# Patient Record
Sex: Male | Born: 2000 | Race: Black or African American | Hispanic: No | Marital: Single
Health system: Southern US, Community
[De-identification: ages and names within clinical notes are randomized; demographics above are authoritative.]

## PROBLEM LIST (undated history)

## (undated) HISTORY — PX: TONSILLECTOMY: SUR1361

---
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
## 2022-07-03 ENCOUNTER — Encounter (HOSPITAL_BASED_OUTPATIENT_CLINIC_OR_DEPARTMENT_OTHER): Payer: Self-pay

## 2022-07-03 ENCOUNTER — Emergency Department (HOSPITAL_BASED_OUTPATIENT_CLINIC_OR_DEPARTMENT_OTHER)
Admission: EM | Admit: 2022-07-03 | Discharge: 2022-07-04 | Disposition: A | Discharge status: Home / Self Care | Payer: Medicaid Other | Attending: Emergency Medicine | Admitting: Emergency Medicine

## 2022-07-03 DIAGNOSIS — Z7722 Contact with and (suspected) exposure to environmental tobacco smoke (acute) (chronic): Secondary | ICD-10-CM | POA: Diagnosis not present

## 2022-07-03 DIAGNOSIS — H9201 Otalgia, right ear: Secondary | ICD-10-CM | POA: Diagnosis present

## 2022-07-03 DIAGNOSIS — H6121 Impacted cerumen, right ear: Secondary | ICD-10-CM

## 2022-07-03 NOTE — ED Triage Notes (Signed)
Rt. Ear pain and difficulty hearing x 2 days

## 2022-07-04 NOTE — Discharge Instructions (Signed)
You were evaluated in the Emergency Department and after careful evaluation, we did not find any emergent condition requiring admission or further testing in the hospital.  Your exam/testing today was overall reassuring.  Symptoms seem to be due to a buildup of wax in the ears.  Recommend using a one-to-one mixture of water and hydrogen peroxide and placing a small amount in the ear laying on your side with the ear up, allowing the liquid to break down the wax for a few minutes, and then rolling over and allowing the ear to drain.  You can repeat this process multiple times a day.  Please return to the Emergency Department if you experience any worsening of your condition.  Thank you for allowing [place] to be a part of your care.

## 2022-07-04 NOTE — ED Provider Notes (Signed)
[facility] Emergency Department Provider Note MRN:  [id number]  Arrival date & time: 07/04/22     Chief Complaint   Otalgia   History of Present Illness   Marcus Jacobson is a 21 y.o. year-old male with no pertinent past medical history presenting to the ED with chief complaint of otalgia.  Feeling of fullness of the right ear for the past 2 days.  Feels like it is clogged.  Not terribly painful.  Feels like he cannot hear out of it very well.  Review of Systems  A thorough review of systems was obtained and all systems are negative except as noted in the HPI and PMH.   Patient's Health History   History reviewed. No pertinent past medical history.  Past Surgical History:  Procedure Laterality Date   TONSILLECTOMY     age 61 years    No family history on file.  Social History   Socioeconomic History   Marital status: Single    Spouse name: Not on file   Number of children: Not on file   Years of education: Not on file   Highest education level: Not on file  Occupational History   Not on file  Tobacco Use   Smoking status: Passive Smoke Exposure - Never Smoker   Smokeless tobacco: Never  Substance and Sexual Activity   Alcohol use: Not on file   Drug use: Not on file   Sexual activity: Not on file  Other Topics Concern   Not on file  Social History Narrative   Not on file   Social Determinants of Health   Financial Resource Strain: Not on file  Food Insecurity: Not on file  Transportation Needs: Not on file  Physical Activity: Not on file  Stress: Not on file  Social Connections: Not on file  Intimate Partner Violence: Not on file     Physical Exam   Vitals:   07/03/22 2110  BP: (!) 143/80  Pulse: 76  Resp: 18  Temp: 99 F (37.2 C)  SpO2: 99%    CONSTITUTIONAL: Well-appearing, NAD NEURO/PSYCH:  Alert and oriented x 3, no focal deficits EYES:  eyes equal and reactive ENT/NECK:  no LAD, no JVD CARDIO: Regular rate,  well-perfused, normal S1 and S2 PULM:  CTAB no wheezing or rhonchi GI/GU:  non-distended, non-tender MSK/SPINE:  No gross deformities, no edema SKIN:  no rash, atraumatic   *Additional and/or pertinent findings included in MDM below  Diagnostic and Interventional Summary    EKG Interpretation  Date/Time:    Ventricular Rate:    PR Interval:    QRS Duration:   QT Interval:    QTC Calculation:   R Axis:     Text Interpretation:         Labs Reviewed - No data to display  No orders to display    Medications - No data to display   Procedures  /  Critical Care Procedures  ED Course and Medical Decision Making  Initial Impression and Ddx Cerumen impaction on exam, fitting with history.  No other complicating features, appropriate for discharge.  Past medical/surgical history that increases complexity of ED encounter: None  Interpretation of Diagnostics Laboratory and/or imaging options to aid in the diagnosis/care of the patient were considered.  After careful history and physical examination, it was determined that there was no indication for diagnostics at this time.  Patient Reassessment and Ultimate Disposition/Management     Discharge  Patient management required discussion with the  following services or consulting groups:  None  Complexity of Problems Addressed Acute complicated illness or Injury  Additional Data Reviewed and Analyzed Further history obtained from: None  Additional Factors Impacting ED Encounter Risk None  Elmer Sow. Pilar Plate, MD [facility] Emergency Medicine [facility] [email or web address]  Final Clinical Impressions(s) / ED Diagnoses     ICD-10-CM   1. Impacted cerumen of right ear  H61.21       ED Discharge Orders     None        Discharge Instructions Discussed with and Provided to Patient:    Discharge Instructions      You were evaluated in the Emergency Department and after careful evaluation, we  did not find any emergent condition requiring admission or further testing in the hospital.  Your exam/testing today was overall reassuring.  Symptoms seem to be due to a buildup of wax in the ears.  Recommend using a one-to-one mixture of water and hydrogen peroxide and placing a small amount in the ear laying on your side with the ear up, allowing the liquid to break down the wax for a few minutes, and then rolling over and allowing the ear to drain.  You can repeat this process multiple times a day.  Please return to the Emergency Department if you experience any worsening of your condition.  Thank you for allowing [place] to be a part of your care.       Sabas Sous, MD 07/04/22 [phone]

## 2023-01-14 ENCOUNTER — Emergency Department (HOSPITAL_BASED_OUTPATIENT_CLINIC_OR_DEPARTMENT_OTHER)
Admission: EM | Admit: 2023-01-14 | Discharge: 2023-01-14 | Disposition: A | Discharge status: Home / Self Care | Payer: Managed Care, Other (non HMO) | Attending: Emergency Medicine | Admitting: Emergency Medicine

## 2023-01-14 ENCOUNTER — Other Ambulatory Visit: Payer: Self-pay

## 2023-01-14 ENCOUNTER — Encounter (HOSPITAL_BASED_OUTPATIENT_CLINIC_OR_DEPARTMENT_OTHER): Payer: Self-pay | Admitting: Emergency Medicine

## 2023-01-14 DIAGNOSIS — R55 Syncope and collapse: Secondary | ICD-10-CM

## 2023-01-14 LAB — COMPREHENSIVE METABOLIC PANEL
ALT: 11 U/L (ref 0–44)
AST: 22 U/L (ref 15–41)
Albumin: 5 g/dL (ref 3.5–5.0)
Alkaline Phosphatase: 42 U/L (ref 38–126)
Anion gap: 7 (ref 5–15)
BUN: 14 mg/dL (ref 6–20)
CO2: 28 mmol/L (ref 22–32)
Calcium: 10 mg/dL (ref 8.9–10.3)
Chloride: 102 mmol/L (ref 98–111)
Creatinine, Ser: 1.06 mg/dL (ref 0.61–1.24)
GFR, Estimated: >60 mL/min (ref >60)
Glucose, Bld: 92 mg/dL (ref 70–99)
Potassium: 4.4 mmol/L (ref 3.5–5.1)
Sodium: 137 mmol/L (ref 135–145)
Total Bilirubin: 1.2 mg/dL (ref 0.3–1.2)
Total Protein: 7.7 g/dL (ref 6.5–8.1)

## 2023-01-14 LAB — LIPASE, BLOOD: Lipase: 17 U/L (ref 11–51)

## 2023-01-14 NOTE — ED Notes (Signed)
Pt aware of the need for a urine... Unable to currently provide.Marland KitchenMarland Kitchen

## 2023-01-14 NOTE — ED Provider Notes (Signed)
[facility] Provider Note   CSN: [id number] Arrival date & time: 01/14/23  1113     History  Chief Complaint  Patient presents with   Abdominal Pain    Marcus Jacobson is a 22 y.o. male.   Abdominal Pain   22 year old male presents emergency department with request of work note.  Patient states that he had an episode this past Thursday where he passed out after having a bowel movement getting up from the toilet.  States that EMS "check me out and cleared me."  Reports some cramping type abdominal pain prompting his visit to the toilet at that time.  States that since then, he has had no abdominal pain.  Denies fever, nausea, vomiting, abdominal pain, chest pain, shortness of breath, urinary symptoms, change in bowel habits.  Patient states that symptoms are isolated to that episode without recurrence.  Denies family history of sudden death at a young age.  Patient currently requesting work note and states "that is all he came here for."  Patient denies visual disturbance, gait abnormalities, weakness/sensory deficits in upper or lower extremities, slurred speech, facial droop, blood thinner use, trauma to head.  No significant pertinent past medical history.  Home Medications Prior to Admission medications   Medication Sig Start Date End Date Taking? Authorizing Provider  ciprofloxacin-dexamethasone (CIPRODEX) otic suspension Place 4 drops into the right ear 2 (two) times daily. For 7 days Patient not taking: Reported on 01/23/2017 08/08/15   Melony Overly, MD      Allergies    Patient has no known allergies.    Review of Systems   Review of Systems  Gastrointestinal:  Positive for abdominal pain.  All other systems reviewed and are negative.   Physical Exam Updated Vital Signs BP 120/83 (BP Location: Right Arm)   Pulse (!) 51   Temp 97.9 F (36.6 C) (Oral)   Resp 18   SpO2 100%  Physical Exam Vitals and nursing note reviewed.   Constitutional:      General: He is not in acute distress.    Appearance: He is well-developed.  HENT:     Head: Normocephalic and atraumatic.  Eyes:     Conjunctiva/sclera: Conjunctivae normal.  Cardiovascular:     Rate and Rhythm: Normal rate and regular rhythm.     Heart sounds: No murmur heard. Pulmonary:     Effort: Pulmonary effort is normal. No respiratory distress.     Breath sounds: Normal breath sounds.  Abdominal:     Palpations: Abdomen is soft.     Tenderness: There is no abdominal tenderness.  Musculoskeletal:        General: No swelling.     Cervical back: Neck supple.  Skin:    General: Skin is warm and dry.     Capillary Refill: Capillary refill takes less than 2 seconds.  Neurological:     Mental Status: He is alert.     Comments: Alert and oriented to self, place, time and event.   Speech is fluent, clear without dysarthria or dysphasia.   Strength 5/5 in upper/lower extremities   Sensation intact in upper/lower extremities   Normal gait.  Negative Romberg. No pronator drift.  Normal finger-to-nose and feet tapping.  CN I not tested  CN II not tested.  CN III, IV, VI PERRLA and EOMs intact bilaterally  CN V Intact sensation to sharp and light touch to the face  CN VII facial movements symmetric  CN VIII  not tested  CN IX, X no uvula deviation, symmetric rise of soft palate  CN XI 5/5 SCM and trapezius strength bilaterally  CN XII Midline tongue protrusion, symmetric L/R movements     Psychiatric:        Mood and Affect: Mood normal.     ED Results / Procedures / Treatments   Labs (all labs ordered are listed, but only abnormal results are displayed) Labs Reviewed  LIPASE, BLOOD  COMPREHENSIVE METABOLIC PANEL  URINALYSIS, ROUTINE W REFLEX MICROSCOPIC  CBC    EKG EKG Interpretation  Date/Time:  Sunday January 14 2023 14:34:13 EST Ventricular Rate:  47 PR Interval:  153 QRS Duration: 69 QT Interval:  435 QTC Calculation: 385 R  Axis:   78 Text Interpretation: Sinus bradycardia early repolarization , present on prior tracing but rate much slower than previous Confirmed by Charlesetta Shanks [id number]) on 01/14/2023 3:39:15 PM  Radiology No results found.  Procedures Procedures    Medications Ordered in ED Medications - No data to display  ED Course/ Medical Decision Making/ A&P                             Medical Decision Making Amount and/or Complexity of Data Reviewed Labs: ordered.   This patient presents to the ED for concern of syncope, this involves an extensive number of treatment options, and is a complaint that carries with it a high risk of complications and morbidity.  The differential diagnosis includes Vasovagal, prolonged QT, Wolff-Parkinson-White, Brugada syndrome, hypertrophic cardiomyopathy, aortic stenosis, pulmonary embolism, thoracic aortic dissection, sepsis, GI bleed, ruptured AAA, subarachnoid hemorrhage, CVA.  Co morbidities that complicate the patient evaluation  See HPI   Additional history obtained:  Additional history obtained from EMR External records from outside source obtained and reviewed including hospital records   Lab Tests:  I Ordered, and personally interpreted labs.  The pertinent results include: No electrolyte abnormalities appreciated.  No transaminitis.  No renal dysfunction.  Base within normal limits.  CBC, UA pending.   Imaging Studies ordered:  N/a   Cardiac Monitoring: / EKG:  The patient was maintained on a cardiac monitor.  I personally viewed and interpreted the cardiac monitored which showed an underlying rhythm of: Sinus rhythm with early repolarization pattern   Consultations Obtained:  N/a   Problem List / ED Course / Critical interventions / Medication management  Syncope  Reevaluation of the patient showed that the patient stayed the same I have reviewed the patients home medicines and have made adjustments as needed   Social  Determinants of Health:  Denies tobacco, illicit drug use   Test / Admission - Considered:  Syncope Vitals signs  within normal range and stable throughout visit. Laboratory studies significant for: See above Patient with evidence of syncopal episode most likely vasovagal in nature given HPI.  Patient asymptomatic now for 3 to 4 days and presenting to the emergency department requesting work note.  Patient overall well-appearing with no abdominal tenderness.  No reported history of sudden cardiac death at young age and family.  I discussed with the patient desire to stay until laboratory studies had been conducted with patient elected leave prior to results.  Patient was deemed capacity.  Patient recommended follow-up with primary care for reassessment of symptoms.  Treatment plan discussed at length with patient and he acknowledged understanding was agreeable to said plan. Worrisome signs and symptoms were discussed with the patient, and the patient  acknowledged understanding to return to the ED if noticed. Patient was stable upon discharge.          Final Clinical Impression(s) / ED Diagnoses Final diagnoses:  Syncope, unspecified syncope type    Rx / DC Orders ED Discharge Orders     None         Wilnette Kales, [place] 01/14/23 Merrily Pew    Charlesetta Shanks, MD 01/18/23 1711

## 2023-01-14 NOTE — Discharge Instructions (Addendum)
Note the visit emergency department was overall reassuring.  Recommend reevaluation by primary care for reassessment of symptoms.  As discussed, make sure to maintain proper oral hydration via electrolyte rich fluids. Make sure to get up slowly from a seated/laying down position.  Please do not hesitate to return to emergency department for worrisome signs and symptoms we discussed become apparent.

## 2023-01-14 NOTE — ED Triage Notes (Signed)
Pt presents to ED POV. Pt c/o generalize abd cramping since thurday. Pt reports pain is intermittent and 3/10. Pt reports that he had a syncopal event after having BM on Thursday. EMS came and cleared him.

## 2023-08-05 ENCOUNTER — Other Ambulatory Visit: Payer: Self-pay

## 2023-08-05 ENCOUNTER — Emergency Department (HOSPITAL_COMMUNITY)
Admission: EM | Admit: 2023-08-05 | Discharge: 2023-08-05 | Disposition: A | Discharge status: Home / Self Care | Payer: 59 | Attending: Emergency Medicine | Admitting: Emergency Medicine

## 2023-08-05 DIAGNOSIS — R42 Dizziness and giddiness: Secondary | ICD-10-CM | POA: Diagnosis not present

## 2023-08-05 DIAGNOSIS — R0981 Nasal congestion: Secondary | ICD-10-CM | POA: Diagnosis not present

## 2023-08-05 DIAGNOSIS — R55 Syncope and collapse: Secondary | ICD-10-CM | POA: Insufficient documentation

## 2023-08-05 LAB — URINALYSIS, ROUTINE W REFLEX MICROSCOPIC
Bilirubin Urine: NEGATIVE
Glucose, UA: NEGATIVE mg/dL
Hgb urine dipstick: NEGATIVE
Ketones, ur: NEGATIVE mg/dL
Leukocytes,Ua: NEGATIVE
Nitrite: NEGATIVE
Protein, ur: NEGATIVE mg/dL
Specific Gravity, Urine: 1.026 (ref 1.005–1.030)
pH: 6 (ref 5.0–8.0)

## 2023-08-05 LAB — BASIC METABOLIC PANEL
Anion gap: 8 (ref 5–15)
BUN: 12 mg/dL (ref 6–20)
CO2: 23 mmol/L (ref 22–32)
Calcium: 8 mg/dL — ABNORMAL LOW (ref 8.9–10.3)
Chloride: 105 mmol/L (ref 98–111)
Creatinine, Ser: 1.05 mg/dL (ref 0.61–1.24)
GFR, Estimated: >60 mL/min (ref >60)
Glucose, Bld: 84 mg/dL (ref 70–99)
Potassium: 3.6 mmol/L (ref 3.5–5.1)
Sodium: 136 mmol/L (ref 135–145)

## 2023-08-05 LAB — CBC
HCT: 40.4 % (ref 39.0–52.0)
Hemoglobin: 13.1 g/dL (ref 13.0–17.0)
MCH: 31.3 pg (ref 26.0–34.0)
MCHC: 32.4 g/dL (ref 30.0–36.0)
MCV: 96.4 fL (ref 80.0–100.0)
Platelets: 166 10*3/uL (ref 150–400)
RBC: 4.19 MIL/uL — ABNORMAL LOW (ref 4.22–5.81)
RDW: 11.4 % — ABNORMAL LOW (ref 11.5–15.5)
WBC: 5 10*3/uL (ref 4.0–10.5)
nRBC: 0 % (ref 0.0–0.2)

## 2023-08-05 LAB — CBG MONITORING, ED: Glucose-Capillary: 73 mg/dL (ref 70–99)

## 2023-08-05 NOTE — ED Triage Notes (Signed)
Pt BIBA from [facility]. C/o syncopal event, pt reports vision going dark before. Unsure if they hit their head, minor pain in L temple area.  Pt has hx syncopal events.  Pr reports symptoms improved by arrival to hospital

## 2023-08-05 NOTE — ED Provider Notes (Signed)
[place] EMERGENCY DEPARTMENT AT [facility] Provider Note   CSN: [id number] Arrival date & time: 08/05/23  1312     History {Add pertinent medical, surgical, social history, OB history to HPI:1} Chief Complaint  Patient presents with   Loss of Consciousness    Marcus Jacobson is a 22 y.o. male.  Patient states that he had not had anything to eat today and he went over to [employer] to eat and while he was there he got dizzy and passed out.  He has had some nasal congestion.  Patient had not had anything to eat when he passed out.  Patient feels fine now   Loss of Consciousness      Home Medications Prior to Admission medications   Medication Sig Start Date End Date Taking? Authorizing Provider  ciprofloxacin-dexamethasone (CIPRODEX) otic suspension Place 4 drops into the right ear 2 (two) times daily. For 7 days Patient not taking: Reported on 01/23/2017 08/08/15   Charm Rings, MD      Allergies    Patient has no known allergies.    Review of Systems   Review of Systems  Cardiovascular:  Positive for syncope.    Physical Exam Updated Vital Signs BP 135/77   Pulse 66   Temp 99.1 F (37.3 C) (Oral)   Resp 12   Ht 5\' 11"  (1.803 m)   Wt 77.1 kg   SpO2 100%   BMI 23.71 kg/m  Physical Exam  ED Results / Procedures / Treatments   Labs (all labs ordered are listed, but only abnormal results are displayed) Labs Reviewed  BASIC METABOLIC PANEL - Abnormal; Notable for the following components:      Result Value   Calcium 8.0 (*)    All other components within normal limits  CBC - Abnormal; Notable for the following components:   RBC 4.19 (*)    RDW 11.4 (*)    All other components within normal limits  URINALYSIS, ROUTINE W REFLEX MICROSCOPIC  CBG MONITORING, ED    EKG EKG Interpretation Date/Time:  Sunday August 05 2023 13:47:09 EDT Ventricular Rate:  70 PR Interval:  145 QRS Duration:  69 QT Interval:  351 QTC Calculation: 379 R  Axis:   75  Text Interpretation: Sinus rhythm ST elev, probable normal early repol pattern Confirmed by Bethann Berkshire [phone]) on 08/05/2023 3:52:31 PM  Radiology No results found.  Procedures Procedures  {Document cardiac monitor, telemetry assessment procedure when appropriate:1}  Medications Ordered in ED Medications - No data to display  ED Course/ Medical Decision Making/ A&P   {   Click here for ABCD2, HEART and other calculatorsREFRESH Note before signing :1}                              Medical Decision Making Amount and/or Complexity of Data Reviewed Labs: ordered.   Syncope probably related to not eating and not drinking and possible viral syndrome.  Patient will follow-up with needed  {Document critical care time when appropriate:1} {Document review of labs and clinical decision tools ie heart score, Chads2Vasc2 etc:1}  {Document your independent review of radiology images, and any outside records:1} {Document your discussion with family members, caretakers, and with consultants:1} {Document social determinants of health affecting pt's care:1} {Document your decision making why or why not admission, treatments were needed:1} Final Clinical Impression(s) / ED Diagnoses Final diagnoses:  Syncope and collapse    Rx / DC Orders  ED Discharge Orders     None

## 2023-08-05 NOTE — Discharge Instructions (Signed)
Drink plenty of fluids.  Make sure you eat 3 meals a day and follow-up with eye doctor if any problems

## 2023-08-06 ENCOUNTER — Ambulatory Visit
Admission: EM | Admit: 2023-08-06 | Discharge: 2023-08-06 | Disposition: A | Discharge status: Home / Self Care | Payer: 59 | Attending: Family Medicine | Admitting: Family Medicine

## 2023-08-06 DIAGNOSIS — J069 Acute upper respiratory infection, unspecified: Secondary | ICD-10-CM | POA: Diagnosis not present

## 2023-08-06 NOTE — Discharge Instructions (Signed)
You were seen today for congestion and body aches.  This is likely viral.  I recommend you use over the counter claritin/zyrtec and flonase nasal spray to help with symptoms.  You may use tylenol for headaches if needed.  Please follow up if not improving or worsening.

## 2023-08-06 NOTE — ED Triage Notes (Signed)
"  Started with head congestion on Saturday and body aches". "On Sunday passed out at [facility] and taken to ED due to this". "ED did lots of testing and said everything was fine". "Today my concerns are what do I do next and/or Rx". Still have "Congestion and occasional body aches, no fever known.

## 2023-08-06 NOTE — ED Provider Notes (Signed)
[facility]    CSN: [id number] Arrival date & time: 08/06/23  0808      History   Chief Complaint Chief Complaint  Patient presents with   Generalized Body Aches    Follow up    HPI Marcus Jacobson is a 22 y.o. male.   He has been having body aches, head congestion.  He went to the ER yesterday after passing out at [employer].  He had lab work and xrays where normal.  The congestion, headache started the day prior to yesterday.  No fevers/chills.  No cough.  No n/v.  He is still having head congestion, body aches are better.  He did take mucinex last night.  He is feeling pretty much back to normal.  No further dizziness.        History reviewed. No pertinent past medical history.  There are no problems to display for this patient.   Past Surgical History:  Procedure Laterality Date   TONSILLECTOMY     age 69 years       Home Medications    Prior to Admission medications   Medication Sig Start Date End Date Taking? Authorizing Provider  ciprofloxacin-dexamethasone (CIPRODEX) otic suspension Place 4 drops into the right ear 2 (two) times daily. For 7 days Patient not taking: Reported on 01/23/2017 08/08/15   Charm Rings, MD    Family History History reviewed. No pertinent family history.  Social History Social History   Tobacco Use   Smoking status: Passive Smoke Exposure - Never Smoker   Smokeless tobacco: Never  Vaping Use   Vaping status: Never Used  Substance Use Topics   Alcohol use: Not Currently    Comment: Occassionally   Drug use: Never     Allergies   Patient has no known allergies.   Review of Systems Review of Systems  Constitutional: Negative.   HENT:  Positive for congestion.   Respiratory: Negative.    Cardiovascular: Negative.   Gastrointestinal: Negative.   Musculoskeletal: Negative.   Neurological:  Positive for headaches.  Psychiatric/Behavioral: Negative.       Physical Exam Triage Vital Signs ED  Triage Vitals  Encounter Vitals Group     BP 08/06/23 0832 116/73     Systolic BP Percentile --      Diastolic BP Percentile --      Pulse Rate 08/06/23 0832 80     Resp 08/06/23 0832 16     Temp 08/06/23 0832 98.3 F (36.8 C)     Temp Source 08/06/23 0832 Oral     SpO2 08/06/23 0832 98 %     Weight 08/06/23 0831 175 lb (79.4 kg)     Height 08/06/23 0831 5\' 11"  (1.803 m)     Head Circumference --      Peak Flow --      Pain Score 08/06/23 0828 0     Pain Loc --      Pain Education --      Exclude from Growth Chart --    No data found.  Updated Vital Signs BP 116/73 (BP Location: Right Arm)   Pulse 80   Temp 98.3 F (36.8 C) (Oral)   Resp 16   Ht 5\' 11"  (1.803 m)   Wt 79.4 kg   SpO2 98%   BMI 24.41 kg/m   Visual Acuity Right Eye Distance:   Left Eye Distance:   Bilateral Distance:    Right Eye Near:   Left Eye Near:  Bilateral Near:     Physical Exam Constitutional:      Appearance: Normal appearance.  HENT:     Nose: Congestion present.     Right Sinus: No maxillary sinus tenderness or frontal sinus tenderness.     Left Sinus: No maxillary sinus tenderness or frontal sinus tenderness.     Mouth/Throat:     Mouth: Mucous membranes are moist.  Cardiovascular:     Rate and Rhythm: Normal rate and regular rhythm.  Pulmonary:     Effort: Pulmonary effort is normal.     Breath sounds: Normal breath sounds.  Musculoskeletal:     Cervical back: Normal range of motion and neck supple.  Skin:    General: Skin is warm.  Neurological:     General: No focal deficit present.     Mental Status: He is alert.  Psychiatric:        Mood and Affect: Mood normal.      UC Treatments / Results  Labs (all labs ordered are listed, but only abnormal results are displayed) Labs Reviewed - No data to display  EKG   Radiology No results found.  Procedures Procedures (including critical care time)  Medications Ordered in UC Medications - No data to  display  Initial Impression / Assessment and Plan / UC Course  I have reviewed the triage vital signs and the nursing notes.  Pertinent labs & imaging results that were available during my care of the patient were reviewed by me and considered in my medical decision making (see chart for details).   Final Clinical Impressions(s) / UC Diagnoses   Final diagnoses:  Acute upper respiratory infection     Discharge Instructions      You were seen today for congestion and body aches.  This is likely viral.  I recommend you use over the counter claritin/zyrtec and flonase nasal spray to help with symptoms.  You may use tylenol for headaches if needed.  Please follow up if not improving or worsening.     ED Prescriptions   None    PDMP not reviewed this encounter.   Jannifer Franklin, MD 08/06/23 [phone]
